# Patient Record
Sex: Male | Born: 1985 | Race: Black or African American | Hispanic: No | Smoking: Never smoker
Health system: Southern US, Community
[De-identification: ages and names within clinical notes are randomized; demographics above are authoritative.]

## PROBLEM LIST (undated history)

## (undated) ENCOUNTER — Emergency Department (HOSPITAL_COMMUNITY): Payer: Self-pay

---
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
## 2008-08-21 ENCOUNTER — Emergency Department (HOSPITAL_COMMUNITY): Admission: EM | Admit: 2008-08-21 | Discharge: 2008-08-21 | Payer: Self-pay | Admitting: Emergency Medicine

## 2011-09-05 ENCOUNTER — Encounter (HOSPITAL_COMMUNITY): Payer: Self-pay | Admitting: Emergency Medicine

## 2011-09-05 ENCOUNTER — Emergency Department (HOSPITAL_COMMUNITY): Payer: Self-pay

## 2011-09-05 ENCOUNTER — Emergency Department (HOSPITAL_COMMUNITY)
Admission: EM | Admit: 2011-09-05 | Discharge: 2011-09-05 | Disposition: A | Discharge status: Home / Self Care | Payer: Self-pay | Attending: Emergency Medicine | Admitting: Emergency Medicine

## 2011-09-05 DIAGNOSIS — T148XXA Other injury of unspecified body region, initial encounter: Secondary | ICD-10-CM

## 2011-09-05 DIAGNOSIS — IMO0002 Reserved for concepts with insufficient information to code with codable children: Secondary | ICD-10-CM | POA: Insufficient documentation

## 2011-09-05 DIAGNOSIS — S8990XA Unspecified injury of unspecified lower leg, initial encounter: Secondary | ICD-10-CM | POA: Insufficient documentation

## 2011-09-05 DIAGNOSIS — F17200 Nicotine dependence, unspecified, uncomplicated: Secondary | ICD-10-CM | POA: Insufficient documentation

## 2011-09-05 DIAGNOSIS — Z23 Encounter for immunization: Secondary | ICD-10-CM | POA: Insufficient documentation

## 2011-09-05 MED ORDER — TETANUS-DIPHTH-ACELL PERTUSSIS 5-2.5-18.5 LF-MCG/0.5 IM SUSP
0.5000 mL | Freq: Once | INTRAMUSCULAR | Status: AC
  Administered 2011-09-05: 0.5 mL via INTRAMUSCULAR
  Filled 2011-09-05: qty 0.5

## 2011-09-05 MED ORDER — CEPHALEXIN 500 MG PO CAPS: 500.0000 mg | ORAL_CAPSULE | Freq: Four times a day (QID) | ORAL | Status: AC

## 2011-09-05 MED ORDER — CEPHALEXIN 250 MG PO CAPS
500.0000 mg | ORAL_CAPSULE | Freq: Once | ORAL | Status: AC
  Administered 2011-09-05: 500 mg via ORAL
  Filled 2011-09-05: qty 2

## 2011-09-05 NOTE — ED Provider Notes (Signed)
History  This chart was scribed for Joe Octave, MD by Bennett Scrape. This patient was seen in room TR06C/TR06C and the patient's care was started at 1:34PM.  CSN: [id number]  Arrival date & time 09/05/11  1259   First MD Initiated Contact with Patient 09/05/11 1334      Chief Complaint  Patient presents with  . Knee Pain    The history is provided by the patient. No language interpreter was used.    Joe [name] is a 26 y.o. male who presents to the Emergency Department complaining of a laceration to the right knee after dropping a school locker on it 2 days ago. He reports that he washed the area with warm soap and water and states that it has been bleeding intermittently since the incident. He states that he has moderate pain with walking but denies shaving problems ambulating. He also c/o 3 weeks of intermittent right-sided HA and reports a mild right-sided HA currently. He denies fever, nausea, and emesis as associated symptoms. He does not have a h/o chronic medical conditions. He is a current everyday smoker and occasional alcohol user. TD status is unknown.   History reviewed. No pertinent past medical history.  History reviewed. No pertinent past surgical history.  No family history on file.  History  Substance Use Topics  . Smoking status: Current Everyday Smoker -- 0.5 packs/day  . Smokeless tobacco: Not on file  . Alcohol Use: Yes      Review of Systems  A complete 10 system review of systems was obtained and all systems are negative except as noted in the HPI and PMH.    Allergies  Review of patient's allergies indicates no known allergies.  Home Medications   Current Outpatient Rx  Name Route Sig Dispense Refill  . IBUPROFEN 200 MG PO TABS Oral Take 200 mg by mouth every 6 (six) hours as needed. For pain      Triage Vitals: BP 128/83  Pulse 100  Temp 98.9 F (37.2 C) (Oral)  Resp 16  SpO2 100%  Physical Exam  Nursing note and vitals  reviewed. Constitutional: He is oriented to person, place, and time. He appears well-developed and well-nourished. No distress.  HENT:  Head: Normocephalic and atraumatic.  Eyes: EOM are normal.  Neck: Neck supple. No tracheal deviation present.  Cardiovascular: Normal rate.   Pulmonary/Chest: Effort normal. No respiratory distress.  Abdominal: Soft. There is no tenderness.  Musculoskeletal: Normal range of motion.  Neurological: He is alert and oriented to person, place, and time.  Skin: Skin is warm and dry.       Crusted flap abrasion to right knee over quadricepts   tendon, full ROM of the knee, flexion and extension intact, no joint effussion, no warmth noted  Psychiatric: He has a normal mood and affect. His behavior is normal.    ED Course  Procedures (including critical care time)  DIAGNOSTIC STUDIES: Oxygen Saturation is 100% on room air, normal by my interpretation.    COORDINATION OF CARE: 1:50PM-Discussed treatment plan which includes an x-ray of the right knee, cleaning the wound and applying steri strips with pt at bedside and pt agreed to plan.  2:37PM-Pt rechecked and feels improved after wound cleansing. Informed pt of negative radiology report and pt acknowledged results. Discussed discharge instructions of Keflex and pt agreed.   Labs Reviewed - No data to display Dg Knee Complete 4 Views Right  09/05/2011  *RADIOLOGY REPORT*  Clinical Data:knee pain  RIGHT  KNEE - COMPLETE 4+ VIEW  Comparison: None.  Findings: There is no evidence of fracture or dislocation.  There is no evidence of arthropathy or other focal bone abnormality. Soft tissues are unremarkable.  IMPRESSION: Negative exam.   Original Report Authenticated By: Rosealee Albee, M.D.      No diagnosis found.    MDM  Flap abrasion to right knee after blunt trauma 2 days ago. Full range of motion of the knee joint. No weakness, numbness or tingling. No fevers or vomiting. No evidence of septic  joint.  Clean wound, update tetanus, x-ray.  I personally performed the services described in this documentation, which was scribed in my presence.  The recorded information has been reviewed and considered.        Joe Octave, MD 09/05/11 1440

## 2011-09-05 NOTE — ED Notes (Signed)
Pt had a locker fall on is right knee this past Tuesday. Pt is able to walk but stated that the knee just hurts pt also states that he has h/a now and then no h/a at this time

## 2011-09-05 NOTE — ED Notes (Signed)
Wound cleaned and neosporin applied with dressing.  Patient tolerated well.

## 2011-09-05 NOTE — ED Notes (Signed)
Discharged home with written and verbal instructions given.  No questions or concerns at discharge.

## 2012-11-29 ENCOUNTER — Emergency Department (HOSPITAL_COMMUNITY)
Admission: EM | Admit: 2012-11-29 | Discharge: 2012-11-29 | Disposition: A | Discharge status: Home / Self Care | Payer: Self-pay | Attending: Emergency Medicine | Admitting: Emergency Medicine

## 2012-11-29 ENCOUNTER — Encounter (HOSPITAL_COMMUNITY): Payer: Self-pay | Admitting: Emergency Medicine

## 2012-11-29 DIAGNOSIS — B86 Scabies: Secondary | ICD-10-CM | POA: Insufficient documentation

## 2012-11-29 DIAGNOSIS — F17200 Nicotine dependence, unspecified, uncomplicated: Secondary | ICD-10-CM | POA: Insufficient documentation

## 2012-11-29 MED ORDER — PERMETHRIN 5 % EX CREA: TOPICAL_CREAM | CUTANEOUS | Status: DC

## 2012-11-29 NOTE — ED Notes (Signed)
Pt states that he used a cover that has been outside for quite awhile and a couple of days ago he started having white bumps on his hands and down to his wrist.

## 2012-11-29 NOTE — ED Notes (Signed)
Pt given d/c instructions and verbalized understanding. NAD at this time.  

## 2012-11-29 NOTE — ED Provider Notes (Signed)
CSN: [id number]     Arrival date & time 11/29/12  1911 History   First MD Initiated Contact with Patient 11/29/12 1916     This chart was scribed for non-physician practitioner, Teressa Lower, NP  working with Hilario Quarry, MD by Arlan Organ, ED Scribe. This patient was seen in room TR02C/TR02C and the patient's care was started at 7:18 PM.   Chief Complaint  Patient presents with  . Rash    The history is provided by the patient. No language interpreter was used.   HPI Comments: Joe [name] is a 27 y.o. male who presents to the Emergency Department complaining of a rash that first appeared 4 days ago. He states he has been using a quilt that has been sitting outside for a long period of time. He reports small white bumps on his hand that have gradually worsened. Pt denies any known allergies.  History reviewed. No pertinent past medical history. History reviewed. No pertinent past surgical history. History reviewed. No pertinent family history. History  Substance Use Topics  . Smoking status: Current Every Day Smoker -- 0.50 packs/day  . Smokeless tobacco: Not on file  . Alcohol Use: Yes    Review of Systems  Skin: Positive for rash.  All other systems reviewed and are negative.    Allergies  Review of patient's allergies indicates no known allergies.  Home Medications   Current Outpatient Rx  Name  Route  Sig  Dispense  Refill  . ibuprofen (ADVIL,MOTRIN) 200 MG tablet   Oral   Take 200 mg by mouth every 6 (six) hours as needed. For pain          There were no vitals taken for this visit. Physical Exam  Nursing note and vitals reviewed. Constitutional: He is oriented to person, place, and time. He appears well-developed and well-nourished.  HENT:  Head: Normocephalic and atraumatic.  Eyes: EOM are normal.  Neck: Normal range of motion.  Cardiovascular: Normal rate.   Pulmonary/Chest: Effort normal.  Musculoskeletal: Normal range of motion.   Neurological: He is alert and oriented to person, place, and time.  Skin:  Raised rash to the bilaterally hands  Psychiatric: He has a normal mood and affect. His behavior is normal.    ED Course  Procedures (including critical care time)  DIAGNOSTIC STUDIES: Oxygen Saturation is 100% on RA, normal by my interpretation.    COORDINATION OF CARE: 7:20 PM-  Discussed treatment plan with pt at bedside and pt agreed to plan.     Labs Review Labs Reviewed - No data to display Imaging Review No results found.  EKG Interpretation   None       MDM   1. Scabies    Treated with elemite  I personally performed the services described in this documentation, which was scribed in my presence. The recorded information has been reviewed and is accurate.     Teressa Lower, NP 11/29/12 2138

## 2012-11-29 NOTE — ED Notes (Signed)
Pt worried about scabies, pt states that the rash started on his finger and has moved down his hands to his wrist. Pt has bilateral white bumps on both hands and wrist. Pt states that there has been itching for the past couple of days.

## 2012-11-29 NOTE — ED Provider Notes (Signed)
History/physical exam/procedure(s) were performed by non-physician practitioner and as supervising physician I was immediately available for consultation/collaboration. I have reviewed all notes and am in agreement with care and plan.   Hilario Quarry, MD 11/29/12 [phone]

## 2013-01-27 ENCOUNTER — Ambulatory Visit: Payer: Self-pay

## 2013-02-05 ENCOUNTER — Ambulatory Visit: Payer: Self-pay

## 2013-08-29 ENCOUNTER — Emergency Department (HOSPITAL_COMMUNITY)
Admission: EM | Admit: 2013-08-29 | Discharge: 2013-08-29 | Disposition: A | Discharge status: Home / Self Care | Payer: Self-pay | Attending: Emergency Medicine | Admitting: Emergency Medicine

## 2013-08-29 ENCOUNTER — Encounter (HOSPITAL_COMMUNITY): Payer: Self-pay | Admitting: Emergency Medicine

## 2013-08-29 DIAGNOSIS — Z79899 Other long term (current) drug therapy: Secondary | ICD-10-CM | POA: Insufficient documentation

## 2013-08-29 DIAGNOSIS — R21 Rash and other nonspecific skin eruption: Secondary | ICD-10-CM | POA: Insufficient documentation

## 2013-08-29 DIAGNOSIS — F17200 Nicotine dependence, unspecified, uncomplicated: Secondary | ICD-10-CM | POA: Insufficient documentation

## 2013-08-29 MED ORDER — HYDROCORTISONE 2.5 % EX LOTN: TOPICAL_LOTION | Freq: Two times a day (BID) | CUTANEOUS | Status: DC

## 2013-08-29 NOTE — ED Provider Notes (Signed)
CSN: [id number]     Arrival date & time 08/29/13  2053 History  This chart was scribed for non-physician practitioner working with Mirian MoMatthew Gentry, MD, by Roxy Cedarhandni Bhalodia ED Scribe. This patient was seen in room TR06C/TR06C and the patient's care was started at 10:39 PM   Chief Complaint  Patient presents with  . Rash   The history is provided by the patient. No language interpreter was used.    HPI Comments: Joe [name] is a 28 y.o. male who presents to the Emergency Department complaining of rashes on both hands, knees, and elbows that began 1 week ago.  Patient states that rash looks like scabs on his hands.  Patient states his skin does not itch an is not painful. Patient states the rash has spread to his elbows and kneecaps since onset. Patient tried using Cortisone today once with no relief. Patient denies any associated shortness of breath and swelling of lips or tongue. Patient denies any sick contact with similar symptoms. 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 Patient denies any prior history of eczema.  History reviewed. No pertinent past medical history. History reviewed. No pertinent past surgical history. History reviewed. No pertinent family history. History  Substance Use Topics  . Smoking status: Current Every Day Smoker -- 0.50 packs/day  . Smokeless tobacco: Not on file  . Alcohol Use: Yes    Review of Systems  Skin: Positive for rash.  All other systems reviewed and are negative.   Allergies  Review of patient's allergies indicates no known allergies.  Home Medications   Prior to Admission medications   Medication Sig Start Date End Date Taking? Authorizing Provider  ibuprofen (ADVIL,MOTRIN) 200 MG tablet Take 200 mg by mouth every 6 (six) hours as needed. For pain    Historical Provider, MD  permethrin (ELIMITE) 5 % cream Apply to affected area once and then again in 2 weeks as needed 11/29/12   Teressa LowerVrinda  Pickering, NP   Triage Vitals: BP 122/74  Pulse 84  Temp(Src) 98.7 F (37.1 C) (Oral)  Resp 18  SpO2 100% Physical Exam  Nursing note and vitals reviewed. Constitutional: He is oriented to person, place, and time. He appears well-developed and well-nourished. No distress.  HENT:  Head: Normocephalic and atraumatic.  Mouth/Throat: Oropharynx is clear and moist.  No oral lesions present. No swelling of lips, tongue or throat.  Eyes: Conjunctivae and EOM are normal. Pupils are equal, round, and reactive to light.  Neck: Neck supple. No tracheal deviation present.  Cardiovascular: Normal rate.   Pulmonary/Chest: Effort normal. No respiratory distress.  Musculoskeletal: Normal range of motion.  Neurological: He is alert and oriented to person, place, and time.  Skin: Skin is warm and dry. Rash noted. There is erythema.  Rash on elbows and kneecaps. Scaly erythmatous rash located on anterior aspect knees, hands, elbows and forearms bilaterally. No active drainage. No rash on the palms or in the webbed spaces of the fingers.  Psychiatric: He has a normal mood and affect. His behavior is normal.    ED Course  Procedures (including critical care time)  DIAGNOSTIC STUDIES: Oxygen Saturation is 100% on RA, normal by my interpretation.    COORDINATION OF CARE: 10:42 PM- Discussed plan to care rash with over the counter medications. Pt advised of plan for treatment and pt agrees.  Labs Review Labs Reviewed - No data to display  Imaging Review No results found.  EKG Interpretation None      MDM   Final diagnoses:  None   Patient presenting with a rash to both hands, knees, and elbows.  Appearance most consistent with Eczema.  Patient afebrile.  No oral lesions.  No swelling of the lips, tongue, or throat.  Lungs CTAB.  Patient stable for discharge.  Return precautions given.   Santiago Glad, PA-C 08/30/13 [phone]

## 2013-08-29 NOTE — ED Notes (Signed)
Presents with 2 days of rash to hands and elbow. Rash is in between fingers and denis itchiness

## 2013-09-01 NOTE — ED Provider Notes (Signed)
Medical screening examination/treatment/procedure(s) were performed by non-physician practitioner and as supervising physician I was immediately available for consultation/collaboration.   EKG Interpretation None        Matthew Gentry, MD 09/01/13 1034 

## 2014-01-20 ENCOUNTER — Emergency Department (HOSPITAL_COMMUNITY): Payer: Self-pay

## 2014-01-20 ENCOUNTER — Emergency Department (HOSPITAL_COMMUNITY)
Admission: EM | Admit: 2014-01-20 | Discharge: 2014-01-20 | Disposition: A | Discharge status: Home / Self Care | Payer: Self-pay | Attending: Emergency Medicine | Admitting: Emergency Medicine

## 2014-01-20 ENCOUNTER — Encounter (HOSPITAL_COMMUNITY): Payer: Self-pay | Admitting: Emergency Medicine

## 2014-01-20 DIAGNOSIS — M25561 Pain in right knee: Secondary | ICD-10-CM

## 2014-01-20 DIAGNOSIS — Y9389 Activity, other specified: Secondary | ICD-10-CM | POA: Insufficient documentation

## 2014-01-20 DIAGNOSIS — Y998 Other external cause status: Secondary | ICD-10-CM | POA: Insufficient documentation

## 2014-01-20 DIAGNOSIS — Y92410 Unspecified street and highway as the place of occurrence of the external cause: Secondary | ICD-10-CM | POA: Insufficient documentation

## 2014-01-20 DIAGNOSIS — Z7952 Long term (current) use of systemic steroids: Secondary | ICD-10-CM | POA: Insufficient documentation

## 2014-01-20 DIAGNOSIS — Z720 Tobacco use: Secondary | ICD-10-CM | POA: Insufficient documentation

## 2014-01-20 DIAGNOSIS — S8991XA Unspecified injury of right lower leg, initial encounter: Secondary | ICD-10-CM | POA: Insufficient documentation

## 2014-01-20 MED ORDER — TRAMADOL HCL 50 MG PO TABS: 50.0000 mg | ORAL_TABLET | Freq: Four times a day (QID) | ORAL | Status: DC | PRN

## 2014-01-20 NOTE — ED Notes (Signed)
Patient in Xray

## 2014-01-20 NOTE — ED Notes (Signed)
PA at bedside.

## 2014-01-20 NOTE — Discharge Instructions (Signed)
Your right knee shows no fractures. There is an effusion which is a swelling of the joint capsule, probably secondary to the injury. Discharging you with tramadol for pain. I would also take over-the-counter anti-inflammatories such as Aleve or Advil. Ice the knee twice a day. If your pain is not improved in 1 week. He will need follow-up with an orthopedic physician.  Knee Pain The knee is the complex joint between your thigh and your lower leg. It is made up of bones, tendons, ligaments, and cartilage. The bones that make up the knee are:  The femur in the thigh.  The tibia and fibula in the lower leg.  The patella or kneecap riding in the groove on the lower femur. CAUSES  Knee pain is a common complaint with many causes. A few of these causes are:  Injury, such as:  A ruptured ligament or tendon injury.  Torn cartilage.  Medical conditions, such as:  Gout  Arthritis  Infections  Overuse, over training, or overdoing a physical activity. Knee pain can be minor or severe. Knee pain can accompany debilitating injury. Minor knee problems often respond well to self-care measures or get well on their own. More serious injuries may need medical intervention or even surgery. SYMPTOMS The knee is complex. Symptoms of knee problems can vary widely. Some of the problems are:  Pain with movement and weight bearing.  Swelling and tenderness.  Buckling of the knee.  Inability to straighten or extend your knee.  Your knee locks and you cannot straighten it.  Warmth and redness with pain and fever.  Deformity or dislocation of the kneecap. DIAGNOSIS  Determining what is wrong may be very straight forward such as when there is an injury. It can also be challenging because of the complexity of the knee. Tests to make a diagnosis may include:  Your caregiver taking a history and doing a physical exam.  Routine X-rays can be used to rule out other problems. X-rays will not reveal a  cartilage tear. Some injuries of the knee can be diagnosed by:  Arthroscopy a surgical technique by which a small video camera is inserted through tiny incisions on the sides of the knee. This procedure is used to examine and repair internal knee joint problems. Tiny instruments can be used during arthroscopy to repair the torn knee cartilage (meniscus).  Arthrography is a radiology technique. A contrast liquid is directly injected into the knee joint. Internal structures of the knee joint then become visible on X-ray film.  An MRI scan is a non X-ray radiology procedure in which magnetic fields and a computer produce two- or three-dimensional images of the inside of the knee. Cartilage tears are often visible using an MRI scanner. MRI scans have largely replaced arthrography in diagnosing cartilage tears of the knee.  Blood work.  Examination of the fluid that helps to lubricate the knee joint (synovial fluid). This is done by taking a sample out using a needle and a syringe. TREATMENT The treatment of knee problems depends on the cause. Some of these treatments are:  Depending on the injury, proper casting, splinting, surgery, or physical therapy care will be needed.  Give yourself adequate recovery time. Do not overuse your joints. If you begin to get sore during workout routines, back off. Slow down or do fewer repetitions.  For repetitive activities such as cycling or running, maintain your strength and nutrition.  Alternate muscle groups. For example, if you are a weight lifter, work the upper  body on one day and the lower body the next.  Either tight or weak muscles do not give the proper support for your knee. Tight or weak muscles do not absorb the stress placed on the knee joint. Keep the muscles surrounding the knee strong.  Take care of mechanical problems.  If you have flat feet, orthotics or special shoes may help. See your caregiver if you need help.  Arch supports,  sometimes with wedges on the inner or outer aspect of the heel, can help. These can shift pressure away from the side of the knee most bothered by osteoarthritis.  A brace called an "unloader" brace also may be used to help ease the pressure on the most arthritic side of the knee.  If your caregiver has prescribed crutches, braces, wraps or ice, use as directed. The acronym for this is PRICE. This means protection, rest, ice, compression, and elevation.  Nonsteroidal anti-inflammatory drugs (NSAIDs), can help relieve pain. But if taken immediately after an injury, they may actually increase swelling. Take NSAIDs with food in your stomach. Stop them if you develop stomach problems. Do not take these if you have a history of ulcers, stomach pain, or bleeding from the bowel. Do not take without your caregiver's approval if you have problems with fluid retention, heart failure, or kidney problems.  For ongoing knee problems, physical therapy may be helpful.  Glucosamine and chondroitin are over-the-counter dietary supplements. Both may help relieve the pain of osteoarthritis in the knee. These medicines are different from the usual anti-inflammatory drugs. Glucosamine may decrease the rate of cartilage destruction.  Injections of a corticosteroid drug into your knee joint may help reduce the symptoms of an arthritis flare-up. They may provide pain relief that lasts a few months. You may have to wait a few months between injections. The injections do have a small increased risk of infection, water retention, and elevated blood sugar levels.  Hyaluronic acid injected into damaged joints may ease pain and provide lubrication. These injections may work by reducing inflammation. A series of shots may give relief for as long as 6 months.  Topical painkillers. Applying certain ointments to your skin may help relieve the pain and stiffness of osteoarthritis. Ask your pharmacist for suggestions. Many over  the-counter products are approved for temporary relief of arthritis pain.  In some countries, doctors often prescribe topical NSAIDs for relief of chronic conditions such as arthritis and tendinitis. A review of treatment with NSAID creams found that they worked as well as oral medications but without the serious side effects. PREVENTION  Maintain a healthy weight. Extra pounds put more strain on your joints.  Get strong, stay limber. Weak muscles are a common cause of knee injuries. Stretching is important. Include flexibility exercises in your workouts.  Be smart about exercise. If you have osteoarthritis, chronic knee pain or recurring injuries, you may need to change the way you exercise. This does not mean you have to stop being active. If your knees ache after jogging or playing basketball, consider switching to swimming, water aerobics, or other low-impact activities, at least for a few days a week. Sometimes limiting high-impact activities will provide relief.  Make sure your shoes fit well. Choose footwear that is right for your sport.  Protect your knees. Use the proper gear for knee-sensitive activities. Use kneepads when playing volleyball or laying carpet. Buckle your seat belt every time you drive. Most shattered kneecaps occur in car accidents.  Rest when you are tired.  SEEK MEDICAL CARE IF:  You have knee pain that is continual and does not seem to be getting better.  SEEK IMMEDIATE MEDICAL CARE IF:  Your knee joint feels hot to the touch and you have a high fever. MAKE SURE YOU:   Understand these instructions.  Will watch your condition.  Will get help right away if you are not doing well or get worse. Document Released: 10/28/2006 Document Revised: 03/25/2011 Document Reviewed: 10/28/2006 [facility] Patient Information 2015 [place], [place]. This information is not intended to replace advice given to you by your health care provider. Make sure you discuss any questions you  have with your health care provider. Knee Effusion The medical term for having fluid in your knee is effusion. This is often due to an internal derangement of the knee. This means something is wrong inside the knee. Some of the causes of fluid in the knee may be torn cartilage, a torn ligament, or bleeding into the joint from an injury. Your knee is likely more difficult to bend and move. This is often because there is increased pain and pressure in the joint. The time it takes for recovery from a knee effusion depends on different factors, including:   Type of injury.  Your age.  Physical and medical conditions.  Rehabilitation Strategies. How long you will be away from your normal activities will depend on what kind of knee problem you have and how much damage is present. Your knee has two types of cartilage. Articular cartilage covers the bone ends and lets your knee bend and move smoothly. Two menisci, thick pads of cartilage that form a rim inside the joint, help absorb shock and stabilize your knee. Ligaments bind the bones together and support your knee joint. Muscles move the joint, help support your knee, and take stress off the joint itself. CAUSES  Often an effusion in the knee is caused by an injury to one of the menisci. This is often a tear in the cartilage. Recovery after a meniscus injury depends on how much meniscus is damaged and whether you have damaged other knee tissue. Small tears may heal on their own with conservative treatment. Conservative means rest, limited weight bearing activity and muscle strengthening exercises. Your recovery may take up to 6 weeks.  TREATMENT  Larger tears may require surgery. Meniscus injuries may be treated during arthroscopy. Arthroscopy is a procedure in which your surgeon uses a small telescope like instrument to look in your knee. Your caregiver can make a more accurate diagnosis (learning what is wrong) by performing an arthroscopic  procedure. If your injury is on the inner margin of the meniscus, your surgeon may trim the meniscus back to a smooth rim. In other cases your surgeon will try to repair a damaged meniscus with stitches (sutures). This may make rehabilitation take longer, but may provide better long term result by helping your knee keep its shock absorption capabilities. Ligaments which are completely torn usually require surgery for repair. HOME CARE INSTRUCTIONS  Use crutches as instructed.  If a brace is applied, use as directed.  Once you are home, an ice pack applied to your swollen knee may help with discomfort and help decrease swelling.  Keep your knee raised (elevated) when you are not up and around or on crutches.  Only take over-the-counter or prescription medicines for pain, discomfort, or fever as directed by your caregiver.  Your caregivers will help with instructions for rehabilitation of your knee. This often includes strengthening exercises.  You may resume a normal diet and activities as directed. SEEK MEDICAL CARE IF:   There is increased swelling in your knee.  You notice redness, swelling, or increasing pain in your knee.  An unexplained oral temperature above 102 F (38.9 C) develops. SEEK IMMEDIATE MEDICAL CARE IF:   You develop a rash.  You have difficulty breathing.  You have any allergic reactions from medications you may have been given.  There is severe pain with any motion of the knee. MAKE SURE YOU:   Understand these instructions.  Will watch your condition.  Will get help right away if you are not doing well or get worse. Document Released: 03/23/2003 Document Revised: 03/25/2011 Document Reviewed: 05/27/2007 [facility] Patient Information 2015 [place], [place]. This information is not intended to replace advice given to you by your health care provider. Make sure you discuss any questions you have with your health care provider.
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
## 2014-01-20 NOTE — ED Notes (Signed)
Pt comfortable with discharge and follow up instructions. Pt declines wheelchair, escorted to waiting area by this RN. Prescriptions x1. 

## 2019-09-12 ENCOUNTER — Emergency Department (HOSPITAL_COMMUNITY)
Admission: EM | Admit: 2019-09-12 | Discharge: 2019-09-12 | Disposition: A | Discharge status: Home / Self Care | Payer: Self-pay | Attending: Emergency Medicine | Admitting: Emergency Medicine

## 2019-09-12 ENCOUNTER — Other Ambulatory Visit: Payer: Self-pay

## 2019-09-12 ENCOUNTER — Encounter (HOSPITAL_COMMUNITY): Payer: Self-pay | Admitting: Emergency Medicine

## 2019-09-12 DIAGNOSIS — Z5321 Procedure and treatment not carried out due to patient leaving prior to being seen by health care provider: Secondary | ICD-10-CM | POA: Insufficient documentation

## 2019-09-12 DIAGNOSIS — R109 Unspecified abdominal pain: Secondary | ICD-10-CM | POA: Insufficient documentation

## 2019-09-12 DIAGNOSIS — R61 Generalized hyperhidrosis: Secondary | ICD-10-CM | POA: Insufficient documentation

## 2019-09-12 DIAGNOSIS — R1110 Vomiting, unspecified: Secondary | ICD-10-CM | POA: Insufficient documentation

## 2019-09-12 LAB — COMPREHENSIVE METABOLIC PANEL
ALT: 43 U/L (ref 0–44)
AST: 55 U/L — ABNORMAL HIGH (ref 15–41)
Albumin: 4.6 g/dL (ref 3.5–5.0)
Alkaline Phosphatase: 55 U/L (ref 38–126)
Anion gap: 16 — ABNORMAL HIGH (ref 5–15)
BUN: 10 mg/dL (ref 6–20)
CO2: 21 mmol/L — ABNORMAL LOW (ref 22–32)
Calcium: 9.6 mg/dL (ref 8.9–10.3)
Chloride: 103 mmol/L (ref 98–111)
Creatinine, Ser: 1.19 mg/dL (ref 0.61–1.24)
GFR calc Af Amer: >60 mL/min (ref >60)
GFR calc non Af Amer: >60 mL/min (ref >60)
Glucose, Bld: 111 mg/dL — ABNORMAL HIGH (ref 70–99)
Potassium: 3.4 mmol/L — ABNORMAL LOW (ref 3.5–5.1)
Sodium: 140 mmol/L (ref 135–145)
Total Bilirubin: 0.7 mg/dL (ref 0.3–1.2)
Total Protein: 7.9 g/dL (ref 6.5–8.1)

## 2019-09-12 LAB — URINALYSIS, ROUTINE W REFLEX MICROSCOPIC
Bilirubin Urine: NEGATIVE
Glucose, UA: NEGATIVE mg/dL
Hgb urine dipstick: NEGATIVE
Ketones, ur: NEGATIVE mg/dL
Leukocytes,Ua: NEGATIVE
Nitrite: NEGATIVE
Protein, ur: 30 mg/dL — AB
Specific Gravity, Urine: 1.026 (ref 1.005–1.030)
pH: 5 (ref 5.0–8.0)

## 2019-09-12 LAB — CBC
HCT: 45.9 % (ref 39.0–52.0)
Hemoglobin: 15.3 g/dL (ref 13.0–17.0)
MCH: 30.4 pg (ref 26.0–34.0)
MCHC: 33.3 g/dL (ref 30.0–36.0)
MCV: 91.1 fL (ref 80.0–100.0)
Platelets: 279 10*3/uL (ref 150–400)
RBC: 5.04 MIL/uL (ref 4.22–5.81)
RDW: 12.4 % (ref 11.5–15.5)
WBC: 6 10*3/uL (ref 4.0–10.5)
nRBC: 0 % (ref 0.0–0.2)

## 2019-09-12 LAB — LIPASE, BLOOD: Lipase: 24 U/L (ref 11–51)

## 2019-09-12 NOTE — ED Notes (Signed)
Pt called x3. No response 

## 2019-09-12 NOTE — ED Triage Notes (Signed)
Pt reports mid abd pain, vomiting, and diaphoresis since this morning.  Denies diarrhea.

## 2020-11-24 ENCOUNTER — Emergency Department (HOSPITAL_COMMUNITY): Payer: Self-pay

## 2020-11-24 ENCOUNTER — Emergency Department (HOSPITAL_COMMUNITY)
Admission: EM | Admit: 2020-11-24 | Discharge: 2020-11-25 | Disposition: A | Discharge status: Home / Self Care | Payer: Self-pay | Attending: Emergency Medicine | Admitting: Emergency Medicine

## 2020-11-24 ENCOUNTER — Encounter (HOSPITAL_COMMUNITY): Payer: Self-pay | Admitting: Surgery

## 2020-11-24 DIAGNOSIS — T148XXA Other injury of unspecified body region, initial encounter: Secondary | ICD-10-CM

## 2020-11-24 DIAGNOSIS — F10920 Alcohol use, unspecified with intoxication, uncomplicated: Secondary | ICD-10-CM

## 2020-11-24 DIAGNOSIS — S3022XA Contusion of scrotum and testes, initial encounter: Secondary | ICD-10-CM

## 2020-11-24 DIAGNOSIS — S71111A Laceration without foreign body, right thigh, initial encounter: Secondary | ICD-10-CM | POA: Insufficient documentation

## 2020-11-24 DIAGNOSIS — R109 Unspecified abdominal pain: Secondary | ICD-10-CM | POA: Insufficient documentation

## 2020-11-24 DIAGNOSIS — Z23 Encounter for immunization: Secondary | ICD-10-CM | POA: Insufficient documentation

## 2020-11-24 LAB — CBC WITH DIFFERENTIAL/PLATELET
Abs Immature Granulocytes: 0.05 10*3/uL (ref 0.00–0.07)
Basophils Absolute: 0 10*3/uL (ref 0.0–0.1)
Basophils Relative: 0 %
Eosinophils Absolute: 0.1 10*3/uL (ref 0.0–0.5)
Eosinophils Relative: 1 %
HCT: 43.1 % (ref 39.0–52.0)
Hemoglobin: 14.5 g/dL (ref 13.0–17.0)
Immature Granulocytes: 1 %
Lymphocytes Relative: 46 %
Lymphs Abs: 3.6 10*3/uL (ref 0.7–4.0)
MCH: 30.5 pg (ref 26.0–34.0)
MCHC: 33.6 g/dL (ref 30.0–36.0)
MCV: 90.5 fL (ref 80.0–100.0)
Monocytes Absolute: 0.6 10*3/uL (ref 0.1–1.0)
Monocytes Relative: 8 %
Neutro Abs: 3.4 10*3/uL (ref 1.7–7.7)
Neutrophils Relative %: 44 %
Platelets: 276 10*3/uL (ref 150–400)
RBC: 4.76 MIL/uL (ref 4.22–5.81)
RDW: 12.2 % (ref 11.5–15.5)
WBC: 7.8 10*3/uL (ref 4.0–10.5)
nRBC: 0 % (ref 0.0–0.2)

## 2020-11-24 LAB — TYPE AND SCREEN
ABO/RH(D): B POS
Antibody Screen: NEGATIVE

## 2020-11-24 LAB — COMPREHENSIVE METABOLIC PANEL
ALT: 42 U/L (ref 0–44)
AST: 51 U/L — ABNORMAL HIGH (ref 15–41)
Albumin: 3.8 g/dL (ref 3.5–5.0)
Alkaline Phosphatase: 52 U/L (ref 38–126)
Anion gap: 14 (ref 5–15)
BUN: 13 mg/dL (ref 6–20)
CO2: 21 mmol/L — ABNORMAL LOW (ref 22–32)
Calcium: 9.4 mg/dL (ref 8.9–10.3)
Chloride: 104 mmol/L (ref 98–111)
Creatinine, Ser: 0.88 mg/dL (ref 0.61–1.24)
GFR, Estimated: >60 mL/min (ref >60)
Glucose, Bld: 102 mg/dL — ABNORMAL HIGH (ref 70–99)
Potassium: 3.8 mmol/L (ref 3.5–5.1)
Sodium: 139 mmol/L (ref 135–145)
Total Bilirubin: 0.6 mg/dL (ref 0.3–1.2)
Total Protein: 7.2 g/dL (ref 6.5–8.1)

## 2020-11-24 LAB — ABO/RH: ABO/RH(D): B POS

## 2020-11-24 LAB — ETHANOL: Alcohol, Ethyl (B): 235 mg/dL — ABNORMAL HIGH (ref <10)

## 2020-11-24 MED ORDER — FENTANYL CITRATE PF 50 MCG/ML IJ SOSY
100.0000 ug | PREFILLED_SYRINGE | Freq: Once | INTRAMUSCULAR | Status: AC
  Administered 2020-11-24: 100 ug via INTRAVENOUS
  Filled 2020-11-24: qty 2

## 2020-11-24 MED ORDER — TETANUS-DIPHTH-ACELL PERTUSSIS 5-2.5-18.5 LF-MCG/0.5 IM SUSY
0.5000 mL | PREFILLED_SYRINGE | Freq: Once | INTRAMUSCULAR | Status: AC
  Administered 2020-11-24: 0.5 mL via INTRAMUSCULAR

## 2020-11-24 MED ORDER — HYDROMORPHONE HCL 1 MG/ML IJ SOLN: 0.5000 mg | Freq: Once | INTRAMUSCULAR | Status: AC

## 2020-11-24 MED ORDER — HYDROMORPHONE HCL 1 MG/ML IJ SOLN: 1.0000 mg | Freq: Once | INTRAMUSCULAR | Status: DC

## 2020-11-24 MED ORDER — IOHEXOL 350 MG/ML SOLN
75.0000 mL | Freq: Once | INTRAVENOUS | Status: AC | PRN
  Administered 2020-11-24: 75 mL via INTRAVENOUS

## 2020-11-24 MED ORDER — LIDOCAINE-EPINEPHRINE (PF) 2 %-1:200000 IJ SOLN
INTRAMUSCULAR | Status: AC
  Filled 2020-11-24: qty 20

## 2020-11-24 MED ORDER — SODIUM CHLORIDE 0.9 % IV SOLN: INTRAVENOUS | Status: DC

## 2020-11-24 MED ORDER — HYDROMORPHONE HCL 1 MG/ML IJ SOLN
INTRAMUSCULAR | Status: AC
  Administered 2020-11-24: 0.5 mg via INTRAVENOUS
  Filled 2020-11-24: qty 1

## 2020-11-24 NOTE — ED Notes (Signed)
MD Freida Busman at bedside to suture pt
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
## 2020-11-24 NOTE — ED Notes (Signed)
Per MD Hyacinth Meeker pt okay to go to [place] with transport - pt taken on monitor
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
## 2020-11-24 NOTE — Progress Notes (Signed)
Orthopedic Tech Progress Note Patient Details:  Joe [name] [birth year] [id number]  Patient ID: [name], male   DOB: Jun 16, 1985, 35 y.o.   MRN: [id number] Trauma response; not needed.  Darleen Crocker 11/24/2020, 9:51 PM

## 2020-11-24 NOTE — ED Notes (Signed)
Pt transported to CT with this RN 
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
## 2020-11-25 MED ORDER — IBUPROFEN 600 MG PO TABS: 600.0000 mg | ORAL_TABLET | Freq: Four times a day (QID) | ORAL | 0 refills | Status: DC | PRN

## 2020-11-25 NOTE — ED Notes (Addendum)
Pt able to ambulate by himself and use the restroom by himself. Pt cleared for DC. Pt given paper scrubs to change into
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
## 2020-11-25 NOTE — ED Notes (Signed)
Patient verbalizes understanding of discharge instructions. Opportunity for questioning and answers were provided. Armband removed by staff, pt discharged from ED via wheelchair.  

## 2020-12-02 ENCOUNTER — Other Ambulatory Visit: Payer: Self-pay

## 2020-12-02 ENCOUNTER — Emergency Department (HOSPITAL_COMMUNITY): Payer: Self-pay

## 2020-12-02 ENCOUNTER — Encounter (HOSPITAL_COMMUNITY): Payer: Self-pay | Admitting: Emergency Medicine

## 2020-12-02 ENCOUNTER — Emergency Department (HOSPITAL_COMMUNITY)
Admission: EM | Admit: 2020-12-02 | Discharge: 2020-12-02 | Disposition: A | Discharge status: Home / Self Care | Payer: Self-pay | Attending: Emergency Medicine | Admitting: Emergency Medicine

## 2020-12-02 DIAGNOSIS — F17210 Nicotine dependence, cigarettes, uncomplicated: Secondary | ICD-10-CM | POA: Insufficient documentation

## 2020-12-02 DIAGNOSIS — S022XXA Fracture of nasal bones, initial encounter for closed fracture: Secondary | ICD-10-CM | POA: Insufficient documentation

## 2020-12-02 DIAGNOSIS — S0240FA Zygomatic fracture, left side, initial encounter for closed fracture: Secondary | ICD-10-CM | POA: Insufficient documentation

## 2020-12-02 DIAGNOSIS — S0181XA Laceration without foreign body of other part of head, initial encounter: Secondary | ICD-10-CM | POA: Insufficient documentation

## 2020-12-02 MED ORDER — IBUPROFEN 800 MG PO TABS: 800.0000 mg | ORAL_TABLET | Freq: Three times a day (TID) | ORAL | 0 refills | Status: AC
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
## 2020-12-02 NOTE — ED Notes (Signed)
Dermabond at bedside.  

## 2020-12-02 NOTE — ED Notes (Signed)
Pt complains of L thumb pain.
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
## 2020-12-02 NOTE — ED Notes (Signed)
Patient transported to CT 

## 2020-12-08 ENCOUNTER — Encounter (HOSPITAL_COMMUNITY): Payer: Self-pay

## 2020-12-08 ENCOUNTER — Other Ambulatory Visit: Payer: Self-pay

## 2020-12-08 ENCOUNTER — Emergency Department (HOSPITAL_COMMUNITY)
Admission: EM | Admit: 2020-12-08 | Discharge: 2020-12-08 | Disposition: A | Discharge status: Home / Self Care | Payer: Self-pay | Attending: Emergency Medicine | Admitting: Emergency Medicine

## 2020-12-08 DIAGNOSIS — F17210 Nicotine dependence, cigarettes, uncomplicated: Secondary | ICD-10-CM | POA: Insufficient documentation

## 2020-12-08 DIAGNOSIS — Z4802 Encounter for removal of sutures: Secondary | ICD-10-CM | POA: Insufficient documentation
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
## 2020-12-08 NOTE — ED Triage Notes (Signed)
Pt here for staples to be removed from inguinal region. Pt has no other complaints at this time.

## 2020-12-08 NOTE — Discharge Instructions (Signed)
The area is healing very well.  You can shower and return to your normal activity.  The hardness around that area will get better with time.  The fracture in your face is not something that we will need surgery.  It will also heal with time.  It is worse in the first 2 weeks but takes a full 6 weeks to heal.

## 2022-02-04 ENCOUNTER — Other Ambulatory Visit: Payer: Self-pay

## 2022-02-04 ENCOUNTER — Emergency Department (HOSPITAL_COMMUNITY)
Admission: EM | Admit: 2022-02-04 | Discharge: 2022-02-05 | Disposition: A | Discharge status: Home / Self Care | Payer: Self-pay | Attending: Emergency Medicine | Admitting: Emergency Medicine

## 2022-02-04 ENCOUNTER — Emergency Department (HOSPITAL_COMMUNITY): Payer: Self-pay

## 2022-02-04 ENCOUNTER — Encounter (HOSPITAL_COMMUNITY): Payer: Self-pay | Admitting: Emergency Medicine

## 2022-02-04 DIAGNOSIS — F10921 Alcohol use, unspecified with intoxication delirium: Secondary | ICD-10-CM

## 2022-02-04 DIAGNOSIS — F10121 Alcohol abuse with intoxication delirium: Secondary | ICD-10-CM | POA: Insufficient documentation

## 2022-02-04 DIAGNOSIS — Y908 Blood alcohol level of 240 mg/100 ml or more: Secondary | ICD-10-CM | POA: Insufficient documentation

## 2022-02-04 DIAGNOSIS — R7309 Other abnormal glucose: Secondary | ICD-10-CM | POA: Insufficient documentation

## 2022-02-04 LAB — CBC WITH DIFFERENTIAL/PLATELET
Abs Immature Granulocytes: 0.03 10*3/uL (ref 0.00–0.07)
Basophils Absolute: 0.1 10*3/uL (ref 0.0–0.1)
Basophils Relative: 1 %
Eosinophils Absolute: 0.2 10*3/uL (ref 0.0–0.5)
Eosinophils Relative: 3 %
HCT: 44.8 % (ref 39.0–52.0)
Hemoglobin: 15.2 g/dL (ref 13.0–17.0)
Immature Granulocytes: 1 %
Lymphocytes Relative: 42 %
Lymphs Abs: 2.7 10*3/uL (ref 0.7–4.0)
MCH: 31 pg (ref 26.0–34.0)
MCHC: 33.9 g/dL (ref 30.0–36.0)
MCV: 91.4 fL (ref 80.0–100.0)
Monocytes Absolute: 0.7 10*3/uL (ref 0.1–1.0)
Monocytes Relative: 11 %
Neutro Abs: 2.6 10*3/uL (ref 1.7–7.7)
Neutrophils Relative %: 42 %
Platelets: 284 10*3/uL (ref 150–400)
RBC: 4.9 MIL/uL (ref 4.22–5.81)
RDW: 12.3 % (ref 11.5–15.5)
WBC: 6.2 10*3/uL (ref 4.0–10.5)
nRBC: 0 % (ref 0.0–0.2)

## 2022-02-04 LAB — BASIC METABOLIC PANEL
Anion gap: 10 (ref 5–15)
BUN: 14 mg/dL (ref 6–20)
CO2: 25 mmol/L (ref 22–32)
Calcium: 8.7 mg/dL — ABNORMAL LOW (ref 8.9–10.3)
Chloride: 105 mmol/L (ref 98–111)
Creatinine, Ser: 1.08 mg/dL (ref 0.61–1.24)
GFR, Estimated: >60 mL/min (ref >60)
Glucose, Bld: 116 mg/dL — ABNORMAL HIGH (ref 70–99)
Potassium: 3.9 mmol/L (ref 3.5–5.1)
Sodium: 140 mmol/L (ref 135–145)

## 2022-02-04 LAB — ETHANOL: Alcohol, Ethyl (B): 328 mg/dL (ref <10)

## 2022-02-04 LAB — CBG MONITORING, ED: Glucose-Capillary: 139 mg/dL — ABNORMAL HIGH (ref 70–99)

## 2022-02-04 NOTE — ED Provider Triage Note (Signed)
Emergency Medicine Provider Triage Evaluation Note  Javon Snee , a 37 y.o. male  was evaluated in triage.  Pt complains of alcohol intoxication.  Per EMS, patient was found down in his vomit at the [place] station.  Patient repeatedly stating during triage "I want to go home".  Patient denies pain at this time.  Review of Systems  Positive:  Negative:   Physical Exam  BP (!) 134/100 (BP Location: Right Arm)   Pulse 91   Temp 98.2 F (36.8 C) (Oral)   Resp 18   SpO2 99%  Gen:   Awake, no distress   Resp:  Normal effort  MSK:   Moves extremities without difficulty  Other:  No spinal tenderness to palpation.  Medical Decision Making  Medically screening exam initiated at 8:15 PM.  Appropriate orders placed.  Kaydyn Sayas was informed that the remainder of the evaluation will be completed by another provider, this initial triage assessment does not replace that evaluation, and the importance of remaining in the ED until their evaluation is complete.  Workup initiated   Dakoda Laventure A, PA-C 02/04/22 2017

## 2022-02-04 NOTE — ED Notes (Signed)
Called for VS in lobby no answer.
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
## 2022-02-05 ENCOUNTER — Encounter (HOSPITAL_COMMUNITY): Payer: Self-pay

## 2022-02-05 NOTE — ED Notes (Signed)
Pt refused repeat vitals.

## 2022-02-05 NOTE — ED Provider Notes (Signed)
[facility] Provider Note   CSN: [id number] Arrival date & time: 02/04/22  2015     History  Chief Complaint  Patient presents with   Alcohol Intoxication    Joe [name] is a 37 y.o. male.  Patient brought to the emerged department for further evaluation after being found [place] un[place].  He was apparently laying in his own vomit.  Was difficult to arouse initially, brought to the ER for evaluation.  At time of my evaluation he is awake and alert.  He is oriented x 3.  He denies any injury or complaints currently.  He does admit to drinking heavily tonight.       Home Medications Prior to Admission medications   Not on File      Allergies    Patient has no known allergies.    Review of Systems   Review of Systems  Physical Exam Updated Vital Signs BP (!) 134/100 (BP Location: Right Arm)   Pulse 91   Temp 98.2 F (36.8 C) (Oral)   Resp 18   SpO2 99%  Physical Exam Vitals and nursing note reviewed.  Constitutional:      General: He is not in acute distress.    Appearance: He is well-developed.  HENT:     Head: Normocephalic and atraumatic.     Mouth/Throat:     Mouth: Mucous membranes are moist.  Eyes:     General: Vision grossly intact. Gaze aligned appropriately.     Extraocular Movements: Extraocular movements intact.     Conjunctiva/sclera: Conjunctivae normal.  Cardiovascular:     Rate and Rhythm: Normal rate and regular rhythm.     Pulses: Normal pulses.     Heart sounds: Normal heart sounds, S1 normal and S2 normal. No murmur heard.    No friction rub. No gallop.  Pulmonary:     Effort: Pulmonary effort is normal. No respiratory distress.     Breath sounds: Normal breath sounds.  Abdominal:     Palpations: Abdomen is soft.     Tenderness: There is no abdominal tenderness. There is no guarding or rebound.     Hernia: No hernia is present.  Musculoskeletal:         General: No swelling.     Cervical back: Full passive range of motion without pain, normal range of motion and neck supple. No pain with movement, spinous process tenderness or muscular tenderness. Normal range of motion.     Right lower leg: No edema.     Left lower leg: No edema.  Skin:    General: Skin is warm and dry.     Capillary Refill: Capillary refill takes less than 2 seconds.     Findings: No ecchymosis, erythema, lesion or wound.  Neurological:     Mental Status: He is alert and oriented to person, place, and time.     GCS: GCS eye subscore is 4. GCS verbal subscore is 5. GCS motor subscore is 6.     Cranial Nerves: Cranial nerves 2-12 are intact.     Sensory: Sensation is intact.     Motor: Motor function is intact. No weakness or abnormal muscle tone.     Coordination: Coordination is intact.  Psychiatric:        Mood and Affect: Mood normal.        Speech: Speech normal.        Behavior: Behavior normal.     ED  Results / Procedures / Treatments   Labs (all labs ordered are listed, but only abnormal results are displayed) Labs Reviewed  ETHANOL - Abnormal; Notable for the following components:      Result Value   Alcohol, Ethyl (B) 328 (*)    All other components within normal limits  BASIC METABOLIC PANEL - Abnormal; Notable for the following components:   Glucose, Bld 116 (*)    Calcium 8.7 (*)    All other components within normal limits  CBG MONITORING, ED - Abnormal; Notable for the following components:   Glucose-Capillary 139 (*)    All other components within normal limits  CBC WITH DIFFERENTIAL/PLATELET  URINALYSIS, ROUTINE W REFLEX MICROSCOPIC  RAPID URINE DRUG SCREEN, HOSP PERFORMED    EKG None  Radiology CT Head Wo Contrast  Result Date: 02/04/2022 CLINICAL DATA:  Unresponsive EXAM: CT HEAD WITHOUT CONTRAST TECHNIQUE: Contiguous axial images were obtained from the base of the skull through the vertex without intravenous contrast. RADIATION  DOSE REDUCTION: This exam was performed according to the departmental dose-optimization program which includes automated exposure control, adjustment of the mA and/or kV according to patient size and/or use of iterative reconstruction technique. COMPARISON:  CT brain 12/02/2020 FINDINGS: Brain: No evidence of acute infarction, hemorrhage, hydrocephalus, extra-axial collection or mass lesion/mass effect. Vascular: No hyperdense vessel or unexpected calcification. Skull: Normal. Negative for fracture or focal lesion. Sinuses/Orbits: No acute finding. Other: None IMPRESSION: Negative non contrasted CT scan of the head. Electronically Signed   By: Donavan Foil M.D.   On: 02/04/2022 22:25   DG Chest 2 View  Result Date: 02/04/2022 CLINICAL DATA:  Found down in emesis EXAM: CHEST - 2 VIEW COMPARISON:  None Available. FINDINGS: The heart size and mediastinal contours are within normal limits. Both lungs are clear. The visualized skeletal structures are unremarkable. IMPRESSION: No active cardiopulmonary disease. Electronically Signed   By: Donavan Foil M.D.   On: 02/04/2022 21:10    Procedures Procedures    Medications Ordered in ED Medications - No data to display  ED Course/ Medical Decision Making/ A&P                             Medical Decision Making  Brought to the emergency department with altered mental status.  Patient found lying on the ground and brought to the ER.  Patient heavily intoxicated at arrival.  He has now awake, alert and oriented.  He has no complaints.  Workup has been unrevealing other than intoxication.  CT head unremarkable.  No neck pain or tenderness.        Final Clinical Impression(s) / ED Diagnoses Final diagnoses:  Alcohol intoxication with delirium [facility])    Rx / DC Orders ED Discharge Orders     None         Waylen Depaolo, Gwenyth Allegra, MD 02/05/22 [phone]

## 2022-02-05 NOTE — ED Notes (Signed)
Patient verbalizes understanding of discharge instructions. Opportunity for questioning and answers were provided. Pt a and o x 4, ambulatory with steady gait; Pt discharged from ED.

## 2022-05-16 IMAGING — CT CT MAXILLOFACIAL W/O CM
3 of 8 series · 14 of 47 positions shown, 17 images · non-contrast
Comparison: None.

CLINICAL DATA: Head trauma.

EXAM:
CT HEAD WITHOUT CONTRAST
CT MAXILLOFACIAL WITHOUT CONTRAST
TECHNIQUE: Multidetector CT imaging of the head and maxillofacial structures
were performed using the standard protocol without intravenous
contrast. Multiplanar CT image reconstructions of the maxillofacial
structures were also generated.

[Series 4: st thins · axial · 0.39mm/px · z∈[-165,-32]mm · 9 of 238 slices shown, 12 images]
[im 24/238  brain]
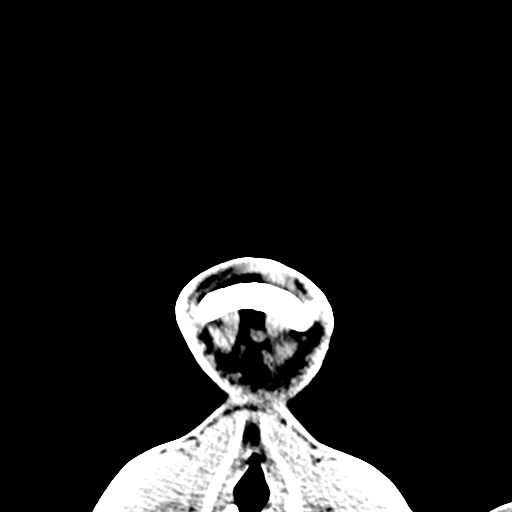
[im 24/238  bone]
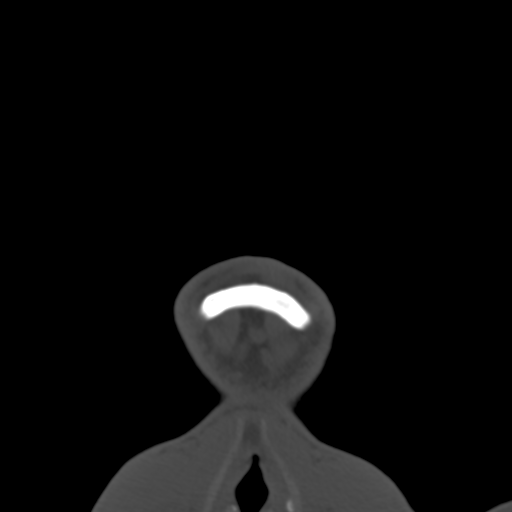
[im 48/238  bone]
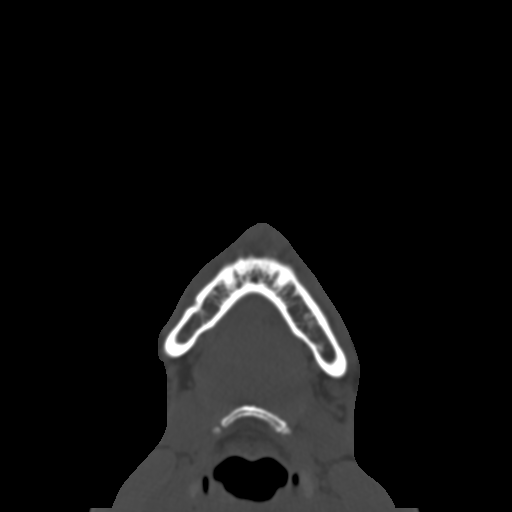
[im 72/238  bone]
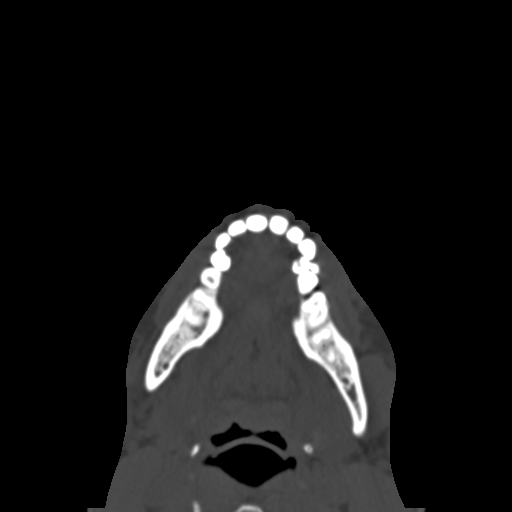
[im 95/238  bone]
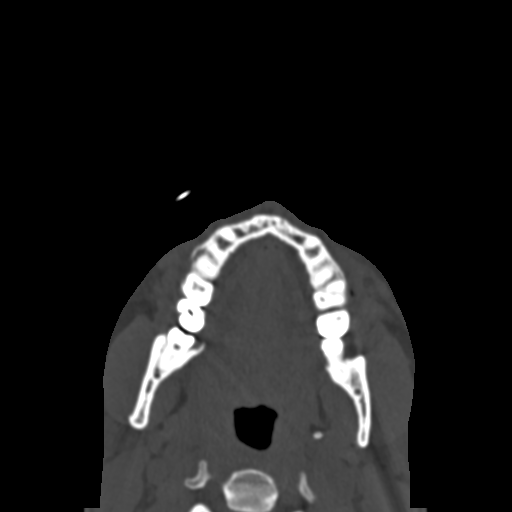
[im 119/238  brain]
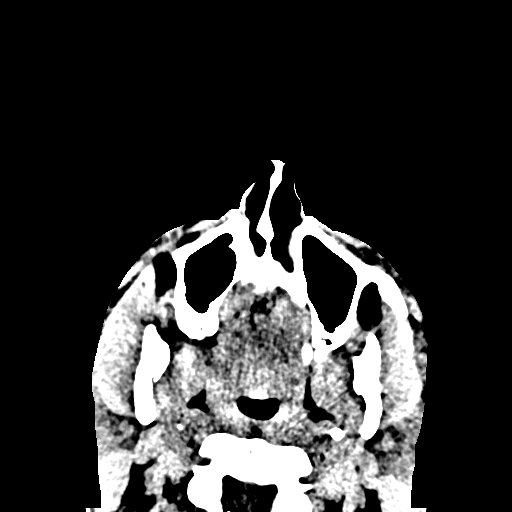
[im 119/238  bone]
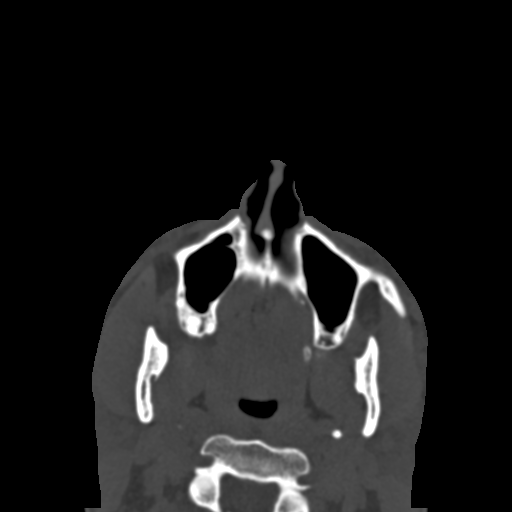
[im 143/238  bone]
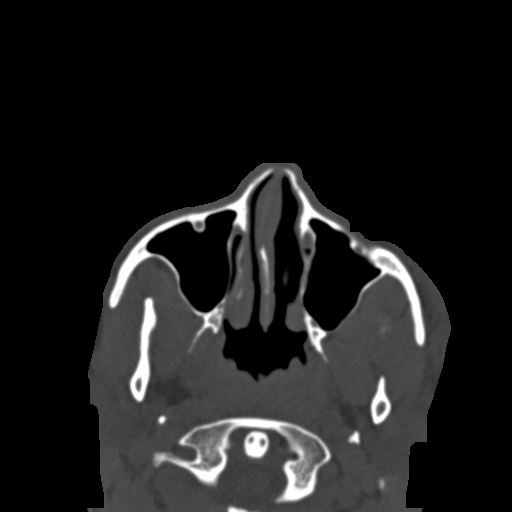
[im 166/238  bone]
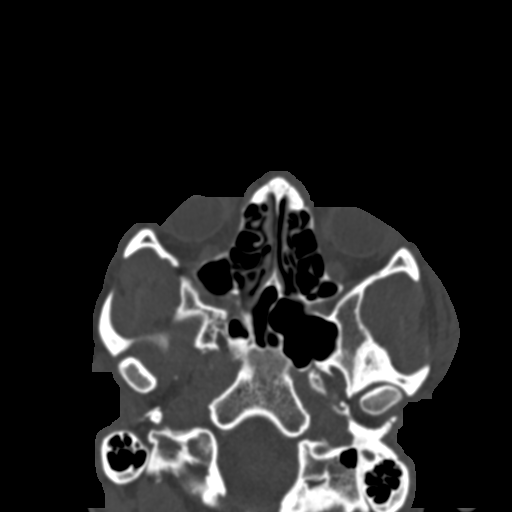
[im 190/238  bone]
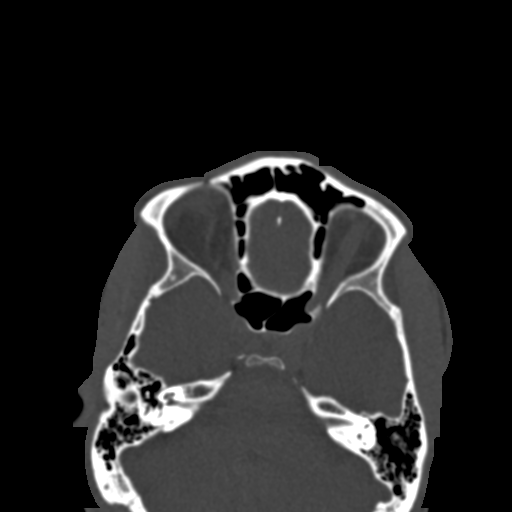
[im 214/238  brain]
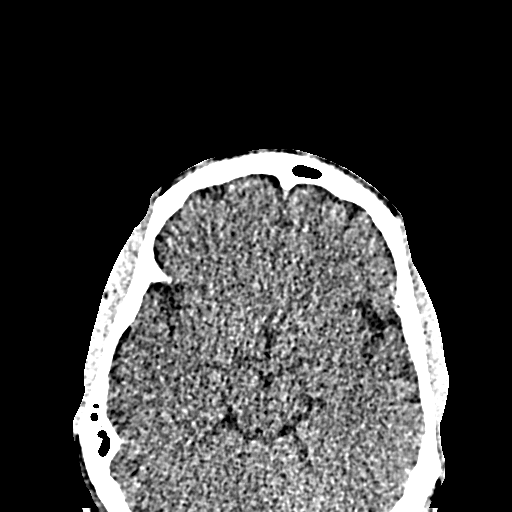
[im 214/238  bone]
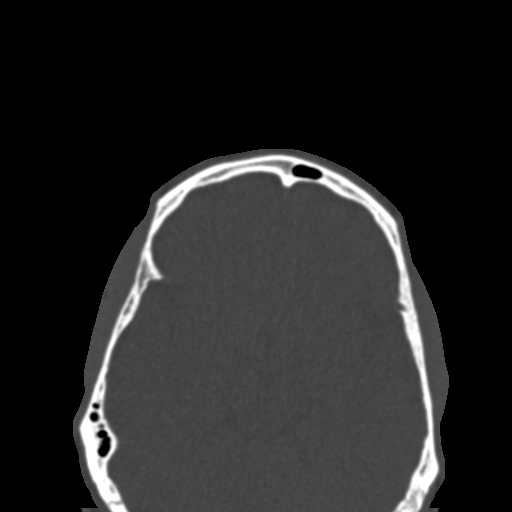

[Series 9: bone cor · coronal · 0.30mm/px · 3 of 104 slices shown]
[im 26/104  bone]
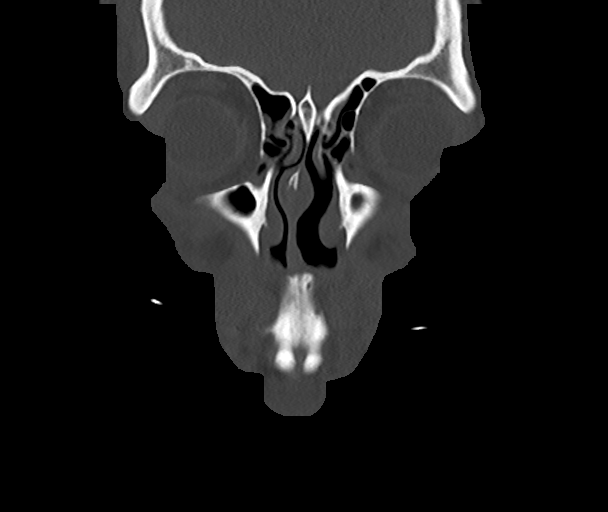
[im 52/104  bone]
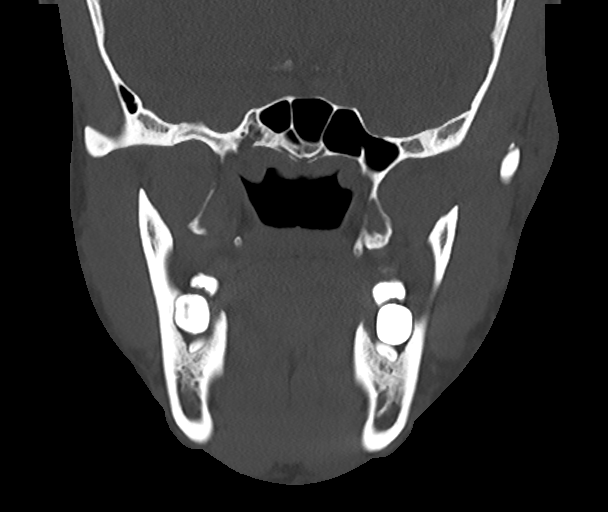
[im 78/104  bone]
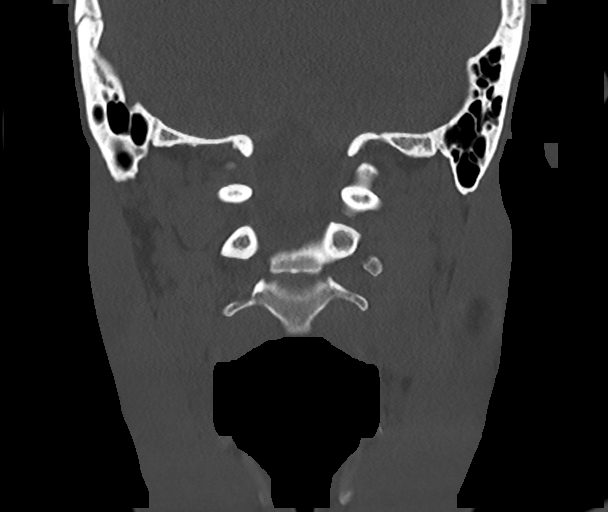

[Series 10: bone sag · sagittal · 0.30mm/px · 2 of 92 slices shown]
[im 31/92  bone]
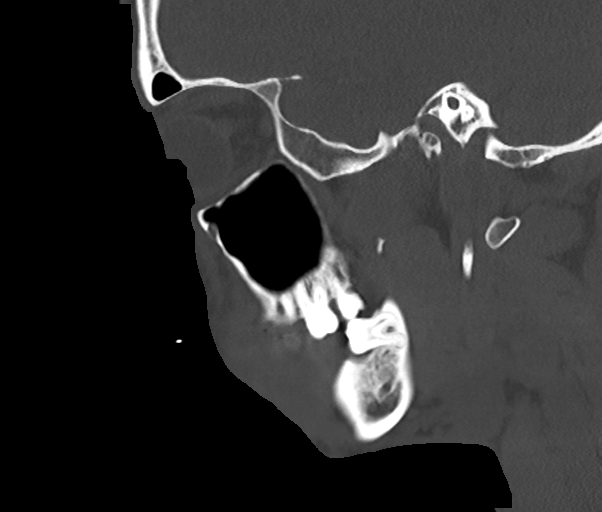
[im 61/92  bone]
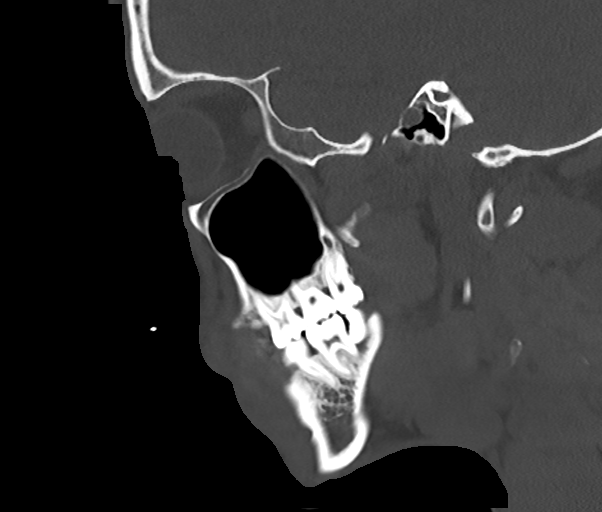

[14 of 47 positions shown; findings below may reference images not displayed]

FINDINGS: CT HEAD FINDINGS

Brain: The ventricles and sulci appropriate size for patient's age.
The gray-white matter discrimination is preserved. There is no acute
intracranial hemorrhage. No mass effect or midline shift. No
extra-axial fluid collection.

Vascular: No hyperdense vessel or unexpected calcification.

Skull: Normal. Negative for fracture or focal lesion.

Other: Left facial and periorbital contusion.

CT MAXILLOFACIAL FINDINGS

Osseous: Minimally displaced fracture of the left zygomatic arch.
Minimal step-off of the right nasal bone, likely chronic.
Correlation with point tenderness recommended. No other acute
fracture. No mandibular dislocation.

Orbits: The globes and retro-orbital fat are preserved.

Sinuses: Mild diffuse mucoperiosteal thickening of paranasal
sinuses. No air-fluid level. The mastoid air cells are clear.

Soft tissues: Left facial and periorbital contusion.
IMPRESSION: 1. Normal unenhanced CT of the brain.
2. Minimally displaced fracture of the left zygomatic arch.
3. Minimal step-off of the right nasal bone, likely chronic.
Correlation with point tenderness recommended.

## 2022-12-16 ENCOUNTER — Emergency Department (HOSPITAL_COMMUNITY)
Admission: EM | Admit: 2022-12-16 | Discharge: 2022-12-17 | Disposition: A | Discharge status: Home / Self Care | Payer: BLUE CROSS/BLUE SHIELD | Attending: Emergency Medicine | Admitting: Emergency Medicine

## 2022-12-16 DIAGNOSIS — F10920 Alcohol use, unspecified with intoxication, uncomplicated: Secondary | ICD-10-CM | POA: Insufficient documentation

## 2022-12-16 DIAGNOSIS — Y909 Presence of alcohol in blood, level not specified: Secondary | ICD-10-CM | POA: Insufficient documentation

## 2022-12-16 NOTE — ED Notes (Addendum)
Pt refusing to sit on bed. Pt obligatory to sitting in wheelchair beside bed, however Pt keeps trying to stand in walk. Pt somewhat redirectable verbally, however somewhat agitated at staff. Pt given food and drink

## 2022-12-16 NOTE — ED Notes (Signed)
Pt able to drink water, and eat a ham sandwich, crackers, and cheese

## 2022-12-16 NOTE — ED Notes (Signed)
Pt keeps trying to leave and asking for food

## 2022-12-16 NOTE — ED Notes (Signed)
Pt pacing back and forth in hallway. Pt asking to speak to physician, stated that he has a "few people he can call" to give him a ride home. Pt stated he wants to know when a doctor is going to say something to him. Provider notified
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
## 2023-02-08 ENCOUNTER — Emergency Department (HOSPITAL_COMMUNITY)
Admission: EM | Admit: 2023-02-08 | Discharge: 2023-02-08 | Disposition: A | Discharge status: Home / Self Care | Payer: BLUE CROSS/BLUE SHIELD | Attending: Emergency Medicine | Admitting: Emergency Medicine

## 2023-02-08 ENCOUNTER — Encounter (HOSPITAL_COMMUNITY): Payer: Self-pay | Admitting: *Deleted

## 2023-02-08 ENCOUNTER — Other Ambulatory Visit: Payer: Self-pay

## 2023-02-08 DIAGNOSIS — F10920 Alcohol use, unspecified with intoxication, uncomplicated: Secondary | ICD-10-CM | POA: Diagnosis present

## 2023-02-08 NOTE — ED Provider Notes (Signed)
[place] EMERGENCY DEPARTMENT AT [facility] Provider Note   CSN: [id number] Arrival date & time: 02/08/23  0208     History  No chief complaint on file.   Joe [name] is a 38 y.o. male.  The history is provided by the patient and the EMS personnel.  Alcohol Intoxication This is a recurrent problem. The problem occurs constantly. The problem has not changed since onset.Pertinent negatives include no chest pain, no abdominal pain, no headaches and no shortness of breath. Nothing aggravates the symptoms. Nothing relieves the symptoms. He has tried nothing for the symptoms. The treatment provided no relief.       Home Medications Prior to Admission medications   Medication Sig Start Date End Date Taking? Authorizing Provider  ibuprofen (ADVIL) 800 MG tablet Take 1 tablet (800 mg total) by mouth 3 (three) times daily. 12/02/20   [name], MD      Allergies    Patient has no known allergies.    Review of Systems   Review of Systems  Respiratory:  Negative for shortness of breath.   Cardiovascular:  Negative for chest pain.  Gastrointestinal:  Negative for abdominal pain.  Neurological:  Negative for headaches.  All other systems reviewed and are negative.   Physical Exam Updated Vital Signs There were no vitals taken for this visit. Physical Exam Vitals and nursing note reviewed.  Constitutional:      General: He is not in acute distress.    Appearance: He is well-developed. He is not diaphoretic.  HENT:     Head: Normocephalic and atraumatic.     Nose: Nose normal.  Eyes:     Conjunctiva/sclera: Conjunctivae normal.     Pupils: Pupils are equal, round, and reactive to light.  Cardiovascular:     Rate and Rhythm: Normal rate and regular rhythm.     Pulses: Normal pulses.     Heart sounds: Normal heart sounds.  Pulmonary:     Effort: Pulmonary effort is normal.     Breath sounds: Normal breath sounds. No wheezing or  rales.  Abdominal:     General: Bowel sounds are normal.     Palpations: Abdomen is soft.     Tenderness: There is no abdominal tenderness. There is no guarding or rebound.  Musculoskeletal:        General: Normal range of motion.     Cervical back: Normal range of motion and neck supple.  Skin:    General: Skin is warm and dry.     Capillary Refill: Capillary refill takes less than 2 seconds.  Neurological:     General: No focal deficit present.     Mental Status: He is alert and oriented to person, place, and time.     Deep Tendon Reflexes: Reflexes normal.     ED Results / Procedures / Treatments   Labs (all labs ordered are listed, but only abnormal results are displayed) Labs Reviewed - No data to display  EKG None  Radiology No results found.  Procedures Procedures    Medications Ordered in ED Medications - No data to display  ED Course/ Medical Decision Making/ A&P                                 Medical Decision Making Patient BIB with EMS for alcohol intoxication   Amount and/or Complexity of Data Reviewed External Data Reviewed: notes.  Details: Previous notes reviewed   Risk Risk Details: Patient does not want to be here.  He has no complaints and stated he did not call EMS.  There is no emergency condition at this time.  Stable for discharge.      Final Clinical Impression(s) / ED Diagnoses Final diagnoses:  Alcoholic intoxication without complication (HCC)   Return for intractable cough, coughing up blood, fevers > 100.4 unrelieved by medication, shortness of breath, intractable vomiting, chest pain, shortness of breath, weakness, numbness, changes in speech, facial asymmetry, abdominal pain, passing out, Inability to tolerate liquids or food, cough, altered mental status or any concerns. No signs of systemic illness or infection. The patient is nontoxic-appearing on exam and vital signs are within normal limits.  I have reviewed the triage  vital signs and the nursing notes. Pertinent labs & imaging results that were available during my care of the patient were reviewed by me and considered in my medical decision making (see chart for details). After history, exam, and medical workup I feel the patient has been appropriately medically screened and is safe for discharge home. Pertinent diagnoses were discussed with the patient. Patient was given return precautions.  Rx / DC Orders ED Discharge Orders     None         Kiannah Grunow, MD 02/08/23 0225

## 2023-02-08 NOTE — ED Triage Notes (Signed)
Pt arrived with EMS for alcohol intoxication. Per report, pt was given the option of jail or hospital, however pt refused all care, unable to obtain vitals. Pt belligerent and uncooperative. Security and police at bedside attempting to redirect patient. Unable to obtain vital signs at this time;
